# Patient Record
Sex: Male | Born: 1985 | Race: White | Hispanic: No | Marital: Married | Smoking: Never smoker
Health system: Southern US, Community
[De-identification: ages and names within clinical notes are randomized; demographics above are authoritative.]

## PROBLEM LIST (undated history)

## (undated) HISTORY — PX: OTHER SURGICAL HISTORY: SHX169

---

## 2015-07-21 ENCOUNTER — Ambulatory Visit (INDEPENDENT_AMBULATORY_CARE_PROVIDER_SITE_OTHER): Payer: BLUE CROSS/BLUE SHIELD | Admitting: Physician Assistant

## 2015-07-21 VITALS — BP 124/80 | HR 68 | Temp 98.5°F | Resp 14 | Ht 67.0 in | Wt 138.0 lb

## 2015-07-21 DIAGNOSIS — Z130 Encounter for screening for diseases of the blood and blood-forming organs and certain disorders involving the immune mechanism: Secondary | ICD-10-CM | POA: Diagnosis not present

## 2015-07-21 DIAGNOSIS — R350 Frequency of micturition: Secondary | ICD-10-CM

## 2015-07-21 DIAGNOSIS — Z7251 High risk heterosexual behavior: Secondary | ICD-10-CM

## 2015-07-21 DIAGNOSIS — Z13220 Encounter for screening for lipoid disorders: Secondary | ICD-10-CM

## 2015-07-21 DIAGNOSIS — Z131 Encounter for screening for diabetes mellitus: Secondary | ICD-10-CM | POA: Diagnosis not present

## 2015-07-21 DIAGNOSIS — Z1329 Encounter for screening for other suspected endocrine disorder: Secondary | ICD-10-CM | POA: Diagnosis not present

## 2015-07-21 DIAGNOSIS — Z0000 Encounter for general adult medical examination without abnormal findings: Secondary | ICD-10-CM | POA: Diagnosis not present

## 2015-07-21 LAB — POCT URINALYSIS DIPSTICK
Bilirubin, UA: NEGATIVE
Glucose, UA: NEGATIVE
KETONES UA: NEGATIVE
Leukocytes, UA: NEGATIVE
Nitrite, UA: NEGATIVE
Protein, UA: NEGATIVE
RBC UA: NEGATIVE
Spec Grav, UA: 1.025
Urobilinogen, UA: 0.2
pH, UA: 6

## 2015-07-21 LAB — POCT UA - MICROSCOPIC ONLY
Bacteria, U Microscopic: NEGATIVE
Casts, Ur, LPF, POC: NEGATIVE
Crystals, Ur, HPF, POC: NEGATIVE
Epithelial cells, urine per micros: NEGATIVE
Yeast, UA: NEGATIVE

## 2015-07-21 NOTE — Progress Notes (Signed)
Subjective:    Patient ID: Robert Schneider., male    DOB: 06-10-1986, 29 y.o.   MRN: [id number]  HPI Patient present for complete physical with complaint of urinary frequency. Frequency has been present for past 2 days and wife was recently treated for UTI. Denies dysuria, urgency, dec vol, abdominal/flank pain.   Does not exercise and not following any particular diet. PMH neg and not taking any medications.  Health Maintenance: UTD on eye and dental exams.   Review of Systems  Constitutional: Negative.  Negative for fever, chills, activity change, appetite change, fatigue and unexpected weight change.  HENT: Negative.  Negative for tinnitus.   Eyes: Negative.  Negative for photophobia and visual disturbance.  Respiratory: Negative.  Negative for cough, shortness of breath and wheezing.   Cardiovascular: Negative.  Negative for chest pain, palpitations and leg swelling.  Gastrointestinal: Negative.  Negative for nausea, vomiting, diarrhea, constipation and blood in stool.  Genitourinary: Positive for frequency. Negative for dysuria, urgency, hematuria, flank pain, decreased urine volume, discharge, penile swelling, scrotal swelling, difficulty urinating, genital sores, penile pain and testicular pain.  Musculoskeletal: Negative for myalgias, back pain, joint swelling, arthralgias and gait problem.  Neurological: Negative.  Negative for dizziness, weakness, light-headedness and headaches.  Psychiatric/Behavioral: Negative.        Objective:   Physical Exam  Constitutional: He is oriented to person, place, and time. He appears well-developed and well-nourished.  Blood pressure 124/80, pulse 68, temperature 98.5 F (36.9 C), temperature source Oral, resp. rate 14, height  (1.702 m), weight 138 lb (62.596 kg), SpO2 98 %.  HENT:  Head: Normocephalic and atraumatic.  Right Ear: Hearing, tympanic membrane, external ear and ear canal normal.  Left Ear: Hearing, tympanic membrane,  external ear and ear canal normal.  Nose: Nose normal.  Mouth/Throat: Uvula is midline, oropharynx is clear and moist and mucous membranes are normal.  Eyes: Conjunctivae, EOM and lids are normal. Pupils are equal, round, and reactive to light. Right eye exhibits no discharge. Left eye exhibits no discharge. No scleral icterus.  Neck: Trachea normal and normal range of motion. Neck supple. Carotid bruit is not present.  Cardiovascular: Normal rate, regular rhythm, normal heart sounds, intact distal pulses and normal pulses.   No murmur heard. Pulmonary/Chest: Effort normal and breath sounds normal. No respiratory distress. He has no decreased breath sounds. He has no wheezes. He has no rhonchi. He has no rales.  Abdominal: Soft. Normal appearance and bowel sounds are normal. He exhibits no distension and no abdominal bruit. There is no tenderness. There is no rebound, no guarding and no CVA tenderness. No hernia.  Musculoskeletal: Normal range of motion. He exhibits no edema or tenderness.  Lymphadenopathy:       Head (right side): No submental, no submandibular, no tonsillar, no preauricular, no posterior auricular and no occipital adenopathy present.       Head (left side): No submental, no submandibular, no tonsillar, no preauricular, no posterior auricular and no occipital adenopathy present.    He has no cervical adenopathy.  Neurological: He is alert and oriented to person, place, and time. He has normal strength and normal reflexes. No cranial nerve deficit or sensory deficit. Coordination and gait normal.  Skin: Skin is warm, dry and intact. No lesion and no rash noted.  Psychiatric: He has a normal mood and affect. His speech is normal and behavior is normal. Judgment and thought content normal.   Results for orders placed or performed  in visit on 07/21/15  POCT UA - Microscopic Only  Result Value Ref Range   WBC, Ur, HPF, POC 0-1    RBC, urine, microscopic 0-2    Bacteria, U  Microscopic neg    Mucus, UA small    Epithelial cells, urine per micros neg    Crystals, Ur, HPF, POC neg    Casts, Ur, LPF, POC neg    Yeast, UA neg   POCT urinalysis dipstick  Result Value Ref Range   Color, UA Dk yellow    Clarity, UA clear    Glucose, UA neg    Bilirubin, UA neg    Ketones, UA neg    Spec Grav, UA 1.025    Blood, UA neg    pH, UA 6.0    Protein, UA neg    Urobilinogen, UA 0.2    Nitrite, UA neg    Leukocytes, UA Negative Negative       Assessment & Plan:  1. Physical exam, annual Age anticipatory guidance given and discussed. Form for work completed and scanned.   2. Urinary frequency UA clear. Will wait for rest of lab work.  3. Screening for deficiency anemia - CBC with Differential/Platelet  4. Screening for lipid disorders - Lipid panel  5. Screening for thyroid disorder - TSH  6. Unprotected sex - HIV antibody - GC/Chlamydia Probe Amp - RPR - POCT UA - Microscopic Only - POCT urinalysis dipstick  7. Screening for diabetes mellitus - Comprehensive metabolic panel   Janan Ridge PA-C  [facility] 07/21/2015 3:04 PM

## 2015-07-21 NOTE — Patient Instructions (Signed)

## 2015-07-22 LAB — COMPREHENSIVE METABOLIC PANEL
ALK PHOS: 64 U/L (ref 40–115)
ALT: 19 U/L (ref 9–46)
AST: 19 U/L (ref 10–40)
Albumin: 4.9 g/dL (ref 3.6–5.1)
BILIRUBIN TOTAL: 0.7 mg/dL (ref 0.2–1.2)
BUN: 17 mg/dL (ref 7–25)
CALCIUM: 9.8 mg/dL (ref 8.6–10.3)
CO2: 28 mmol/L (ref 20–31)
CREATININE: 0.86 mg/dL (ref 0.60–1.35)
Chloride: 102 mmol/L (ref 98–110)
GLUCOSE: 60 mg/dL — AB (ref 65–99)
POTASSIUM: 3.9 mmol/L (ref 3.5–5.3)
SODIUM: 139 mmol/L (ref 135–146)
TOTAL PROTEIN: 7.4 g/dL (ref 6.1–8.1)

## 2015-07-22 LAB — LIPID PANEL
Cholesterol: 147 mg/dL (ref 125–200)
HDL: 48 mg/dL (ref >=40)
LDL Cholesterol: 87 mg/dL (ref <130)
TRIGLYCERIDES: 61 mg/dL (ref <150)
Total CHOL/HDL Ratio: 3.1 Ratio (ref <=5.0)
VLDL: 12 mg/dL (ref <30)

## 2015-07-22 LAB — CBC WITH DIFFERENTIAL/PLATELET
Basophils Absolute: 0 10*3/uL (ref 0.0–0.1)
Basophils Relative: 0 % (ref 0–1)
Eosinophils Absolute: 0.2 10*3/uL (ref 0.0–0.7)
Eosinophils Relative: 2 % (ref 0–5)
HEMATOCRIT: 47.9 % (ref 39.0–52.0)
Hemoglobin: 16.5 g/dL (ref 13.0–17.0)
Lymphocytes Relative: 27 % (ref 12–46)
Lymphs Abs: 2.1 10*3/uL (ref 0.7–4.0)
MCH: 31.9 pg (ref 26.0–34.0)
MCHC: 34.4 g/dL (ref 30.0–36.0)
MCV: 92.6 fL (ref 78.0–100.0)
MPV: 8.6 fL (ref 8.6–12.4)
Monocytes Absolute: 0.6 10*3/uL (ref 0.1–1.0)
Monocytes Relative: 8 % (ref 3–12)
NEUTROS PCT: 63 % (ref 43–77)
Neutro Abs: 4.9 10*3/uL (ref 1.7–7.7)
PLATELETS: 295 10*3/uL (ref 150–400)
RBC: 5.17 MIL/uL (ref 4.22–5.81)
RDW: 12.5 % (ref 11.5–15.5)
WBC: 7.8 10*3/uL (ref 4.0–10.5)

## 2015-07-22 LAB — TSH: TSH: 1.442 u[IU]/mL (ref 0.350–4.500)

## 2015-07-22 LAB — GC/CHLAMYDIA PROBE AMP
CT PROBE, AMP APTIMA: NEGATIVE
GC PROBE AMP APTIMA: NEGATIVE

## 2015-07-22 LAB — HIV ANTIBODY (ROUTINE TESTING W REFLEX): HIV 1&2 Ab, 4th Generation: NONREACTIVE

## 2015-07-22 LAB — RPR

## 2015-07-31 ENCOUNTER — Telehealth: Payer: Self-pay | Admitting: Physician Assistant

## 2015-07-31 ENCOUNTER — Encounter: Payer: Self-pay | Admitting: Physician Assistant

## 2015-07-31 NOTE — Telephone Encounter (Signed)
Vmail full. Will send results which were all normal.

## 2021-05-05 ENCOUNTER — Emergency Department (HOSPITAL_COMMUNITY): Payer: 59

## 2021-05-05 ENCOUNTER — Encounter (HOSPITAL_COMMUNITY): Payer: Self-pay

## 2021-05-05 ENCOUNTER — Emergency Department (HOSPITAL_COMMUNITY)
Admission: EM | Admit: 2021-05-05 | Discharge: 2021-05-06 | Disposition: A | Discharge status: Home / Self Care | Payer: 59 | Attending: Emergency Medicine | Admitting: Emergency Medicine

## 2021-05-05 ENCOUNTER — Other Ambulatory Visit: Payer: Self-pay

## 2021-05-05 DIAGNOSIS — R202 Paresthesia of skin: Secondary | ICD-10-CM

## 2021-05-05 LAB — CBC WITH DIFFERENTIAL/PLATELET
Abs Immature Granulocytes: 0.01 10*3/uL (ref 0.00–0.07)
Basophils Absolute: 0.1 10*3/uL (ref 0.0–0.1)
Basophils Relative: 1 %
Eosinophils Absolute: 0.2 10*3/uL (ref 0.0–0.5)
Eosinophils Relative: 3 %
HCT: 42.1 % (ref 39.0–52.0)
Hemoglobin: 14.5 g/dL (ref 13.0–17.0)
Immature Granulocytes: 0 %
Lymphocytes Relative: 34 %
Lymphs Abs: 2.3 10*3/uL (ref 0.7–4.0)
MCH: 32.4 pg (ref 26.0–34.0)
MCHC: 34.4 g/dL (ref 30.0–36.0)
MCV: 94.2 fL (ref 80.0–100.0)
Monocytes Absolute: 0.6 10*3/uL (ref 0.1–1.0)
Monocytes Relative: 8 %
Neutro Abs: 3.6 10*3/uL (ref 1.7–7.7)
Neutrophils Relative %: 54 %
Platelets: 226 10*3/uL (ref 150–400)
RBC: 4.47 MIL/uL (ref 4.22–5.81)
RDW: 11.2 % — ABNORMAL LOW (ref 11.5–15.5)
WBC: 6.6 10*3/uL (ref 4.0–10.5)
nRBC: 0 % (ref 0.0–0.2)

## 2021-05-05 LAB — BASIC METABOLIC PANEL
Anion gap: 6 (ref 5–15)
BUN: 13 mg/dL (ref 6–20)
CO2: 28 mmol/L (ref 22–32)
Calcium: 9 mg/dL (ref 8.9–10.3)
Chloride: 103 mmol/L (ref 98–111)
Creatinine, Ser: 0.91 mg/dL (ref 0.61–1.24)
GFR, Estimated: >60 mL/min (ref >60)
Glucose, Bld: 105 mg/dL — ABNORMAL HIGH (ref 70–99)
Potassium: 3.3 mmol/L — ABNORMAL LOW (ref 3.5–5.1)
Sodium: 137 mmol/L (ref 135–145)

## 2021-05-05 LAB — URINALYSIS, ROUTINE W REFLEX MICROSCOPIC
Bilirubin Urine: NEGATIVE
Glucose, UA: NEGATIVE mg/dL
Hgb urine dipstick: NEGATIVE
Ketones, ur: NEGATIVE mg/dL
Leukocytes,Ua: NEGATIVE
Nitrite: NEGATIVE
Protein, ur: NEGATIVE mg/dL
Specific Gravity, Urine: 1.01 (ref 1.005–1.030)
pH: 6 (ref 5.0–8.0)

## 2021-05-05 NOTE — ED Notes (Signed)
Pt states whenever he urinates, urine will trickle out. "my only partner has been by wife". Denies any pain or discharge. Requesting to give urine sample.

## 2021-05-05 NOTE — ED Triage Notes (Signed)
Left facial numbness started today at 1800. Denies any other symptoms. Denies any slurring of speech, trauma, facial droop, and pain.    Pt states his BP has been running high 160/90s throughout this week and he isnt on any meds.

## 2021-05-05 NOTE — ED Provider Notes (Signed)
[name] EMERGENCY DEPARTMENT Provider Note   CSN: [id number] Arrival date & time: 05/05/21  1859     History Chief Complaint  Patient presents with  . facial numbness   Robert Schneider. is a 35 y.o. male with no significant past medical history who presents to [facility] ED for evaluation of facial numbness.   Patient reports that earlier this week on either Tuesday or Wednesday evening, he experienced a left temporal headache which he rates was a 5 to 6 out of 10 in severity and associated with distended veins in his left temple. He denies pain with chewing or changes in his vision. Patient did not take anything for this headache and it resolved by the time that he woke up the next morning. He states that he gets headaches approximately once a month, however this one felt differently. He took his blood pressure at home using a blood pressure cuff which was elevated to the 140s systolic. He was feeling well until earlier today when he experienced the onset of some mild numbness to his left cheek. Otherwise, he denies any further associated symptoms.     No past medical history on file.  There are no problems to display for this patient.   Past Surgical History:  Procedure Laterality Date  . eutachian tube surgery      Family History  Problem Relation Age of Onset  . Stroke Father    Social History   Tobacco Use  . Smoking status: Never Smoker  . Smokeless tobacco: Never Used  Substance Use Topics  . Alcohol use: No    Alcohol/week: 0.0 standard drinks  . Drug use: No   Home Medications Prior to Admission medications   Not on File   Allergies    Patient has no known allergies.  Review of Systems   Review of Systems  Constitutional: Negative for chills and fever.  HENT: Negative for ear pain and sore throat.   Eyes: Negative for pain and visual disturbance.  Respiratory: Negative for cough and shortness of breath.   Cardiovascular:  Negative for chest pain and palpitations.  Gastrointestinal: Negative for abdominal pain and vomiting.  Genitourinary: Negative for dysuria and hematuria.  Musculoskeletal: Negative for arthralgias and back pain.  Skin: Negative for color change and rash.  Neurological: Negative for seizures and syncope.       Numbness of left cheek  All other systems reviewed and are negative.   Physical Exam Updated Vital Signs BP (!) 158/99   Pulse 82   Temp 98.2 F (36.8 C) (Oral)   Resp 17   Ht 5\' 7"  (1.702 m)   Wt 63.5 kg   SpO2 100%   BMI 21.93 kg/m   Physical Exam Vitals and nursing note reviewed.  Constitutional:      General: He is not in acute distress.    Appearance: He is well-developed. He is not ill-appearing.  HENT:     Head: Normocephalic and atraumatic.  Eyes:     Conjunctiva/sclera: Conjunctivae normal.  Cardiovascular:     Rate and Rhythm: Normal rate and regular rhythm.     Heart sounds: No murmur heard.   Pulmonary:     Effort: Pulmonary effort is normal. No respiratory distress.     Breath sounds: Normal breath sounds.  Abdominal:     Palpations: Abdomen is soft.     Tenderness: There is no abdominal tenderness.  Musculoskeletal:     Cervical back: Neck supple.  Skin:    General: Skin is warm and dry.  Neurological:     General: No focal deficit present.     Mental Status: He is alert and oriented to person, place, and time. Mental status is at baseline.     Cranial Nerves: No cranial nerve deficit.     Sensory: No sensory deficit.     Motor: No weakness.     Comments: Cranial nerves intact, no sensory deficit or weakness on examination.    ED Results / Procedures / Treatments   Labs (all labs ordered are listed, but only abnormal results are displayed) Labs Reviewed  URINALYSIS, ROUTINE W REFLEX MICROSCOPIC - Abnormal; Notable for the following components:      Result Value   Color, Urine STRAW (*)    All other components within normal limits   BASIC METABOLIC PANEL - Abnormal; Notable for the following components:   Potassium 3.3 (*)    Glucose, Bld 105 (*)    All other components within normal limits  CBC WITH DIFFERENTIAL/PLATELET - Abnormal; Notable for the following components:   RDW 11.2 (*)    All other components within normal limits   EKG None  Radiology CT Head Wo Contrast  Result Date: 05/05/2021 CLINICAL DATA:  Elevated blood pressure with bulging veins on the left side of his head. EXAM: CT HEAD WITHOUT CONTRAST TECHNIQUE: Contiguous axial images were obtained from the base of the skull through the vertex without intravenous contrast. COMPARISON:  None. FINDINGS: Brain: No evidence of acute infarction, hydrocephalus, extra-axial collection or mass lesion. An ill-defined 3 mm focus of mildly increased attenuation is seen along the junction of the cortex and white matter within the right parieto-occipital region (axial CT image 18, CT series number 3/coronal reformatted image 46, CT series number 5). There is no evidence of associated edema, mass effect or midline shift. Vascular: No hyperdense vessel or unexpected calcification. Skull: Normal. Negative for fracture or focal lesion. Sinuses/Orbits: No acute finding. Other: None. IMPRESSION: Tiny hyperdense focus within the right parieto-occipital region which may represent a very small amount of acute parenchymal blood. MRI correlation is recommended. Electronically Signed   By: Aram Candela M.D.   On: 05/05/2021 20:38   Medications Ordered in ED Medications - No data to display  ED Course  I have reviewed the triage vital signs and the nursing notes.  Pertinent labs & imaging results that were available during my care of the patient were reviewed by me and considered in my medical decision making (see chart for details).    MDM Rules/Calculators/A&P                          Patient presents with left temporal headache a few days ago followed by the onset of  left-sided facial numbness earlier today. Patient hemodynamically stable with unremarkable CBC, BMP and urinalysis. Temporal arteritis considered although patient denies visual symptoms, jaw pain, fever and has no pain with palpation of the temple. Patient's symptoms concerning for CVA therefore head CT obtained which showed hyperdense focus in the right parieto-occipital region with recommendation for MRI correlation. Discussed patient's case with neurology with recommendation to obtain MRI Brain with and without contrast. Signed off patient to Dr. Pilar Plate for further care.  Final Clinical Impression(s) / ED Diagnoses Final diagnoses:  None    Rx / DC Orders ED Discharge Orders    None       Roylene Reason, MD 05/05/21 2323  Charlynne Pander, MD 05/06/21 [phone]

## 2021-05-05 NOTE — ED Provider Notes (Signed)
Emergency Medicine Provider Triage Evaluation Note  Robert Schneider. , a 35 y.o. male  was evaluated in triage.  Pt complains of Urinary dribbling however is primarily here for headache.  He states he has had 2 episodes of severe left temporal headache.  No vision changes but states that he does not have a history of headaches this is a new issue for him.  He states he has associated numbness on the left side of his face.  He is uncertain whether he had a facial droop and was not told that he did.  He denies any slurred speech or confusion.  No head injuries or trauma.  He has no history of cancer.  Review of Systems  Positive: Headache, facial numbness Negative: Nausea vomiting  Physical Exam  BP (!) 139/105 (BP Location: Right Arm)   Pulse 74   Temp 98.2 F (36.8 C) (Oral)   Resp 16   Ht 5\' 7"  (1.702 m)   Wt 63.5 kg   SpO2 100%   BMI 21.93 kg/m  Gen:   Awake, no distress   Resp:  Normal effort  MSK:   Moves extremities without difficulty  Other:   Alert and oriented to self, place, time and event.   Speech is fluent, clear without dysarthria or dysphasia.   Strength 5/5 in upper/lower extremities   Sensation intact in upper/lower extremities   Normal gait.  CN I not tested  CN II grossly intact visual fields bilaterally. Did not visualize posterior eye.  CN III, IV, VI PERRLA and EOMs intact bilaterally  CN V Intact sensation to sharp and light touch to the face  CN VII facial movements symmetric  CN VIII not tested  CN IX, X no uvula deviation, symmetric rise of soft palate  CN XI 5/5 SCM and trapezius strength bilaterally  CN XII Midline tongue protrusion, symmetric L/R movements    Medical Decision Making  Medically screening exam initiated at 7:44 PM.  Appropriate orders placed.  . was informed that the remainder of the evaluation will be completed by another provider, this initial triage assessment does not replace that evaluation, and the  importance of remaining in the ED until their evaluation is complete.  Patient is overall very well-appearing but quite nervous about his symptoms.  Given his new headache and he is now history of occasional headaches he will obtain CT head without contrast low suspicion for tumor but given his numbness of his face have some concern for trigeminal neuralgia versus complex migraine versus migraine  No visual symptoms indicate giant cell arteritis also he is very young for this.   Veronda Prude Berwind, DOLE 05/05/21 1954    05/07/21, MD 05/05/21 2008

## 2021-05-06 MED ORDER — GADOBUTROL 1 MMOL/ML IV SOLN
6.5000 mL | Freq: Once | INTRAVENOUS | Status: AC | PRN
  Administered 2021-05-06: 6.5 mL via INTRAVENOUS

## 2021-05-06 NOTE — ED Provider Notes (Signed)
  Provider Note MRN:  [id number]  Arrival date & time: 05/06/21    ED Course and Medical Decision Making  Assumed care from Dr. Silverio Lay at shift change.  Headache with facial sensory abnormalities, suspect complex migraine but will obtain MRI to exclude acute stroke.  MRI is negative, on exam patient is doing well without any neurological complaints or deficits, appropriate for discharge.  Procedures  Final Clinical Impressions(s) / ED Diagnoses     ICD-10-CM   1. Facial paresthesia  R20.2     ED Discharge Orders    None        Discharge Instructions     You were evaluated in the Emergency Department and after careful evaluation, we did not find any emergent condition requiring admission or further testing in the hospital.  Your exam/testing today is overall reassuring.  MRI did not show any abnormalities.  Recommend follow-up with the neurologist for further management.  Please return to the Emergency Department if you experience any worsening of your condition.   Thank you for allowing [place] to be a part of your care.     Elmer Sow. Pilar Plate, MD [facility] Emergency Medicine [facility] [email or web address]    Sabas Sous, MD 05/06/21 [phone]

## 2021-05-06 NOTE — Discharge Instructions (Signed)
You were evaluated in the Emergency Department and after careful evaluation, we did not find any emergent condition requiring admission or further testing in the hospital.  Your exam/testing today is overall reassuring.  MRI did not show any abnormalities.  Recommend follow-up with the neurologist for further management.  Please return to the Emergency Department if you experience any worsening of your condition.   Thank you for allowing [place] to be a part of your care.

## 2022-11-21 IMAGING — MR MR HEAD WO/W CM
13 of 18 series · 28 of 48 positions shown · IV contrast (gadavist)
Comparison: None.

CLINICAL DATA: Transient ischemic attack

EXAM:
MRI HEAD WITHOUT AND WITH CONTRAST
TECHNIQUE: Multiplanar, multiecho pulse sequences of the brain and surrounding
structures were obtained without and with intravenous contrast.
CONTRAST:  6.5mL GADAVIST GADOBUTROL 1 MMOL/ML IV SOLN

[Series 5: DWI · axial · 3.0mm · 0.88mm/px · z∈[-129,+17]mm · 4 of 100 slices shown (1 of 4)]
[im 1/100]
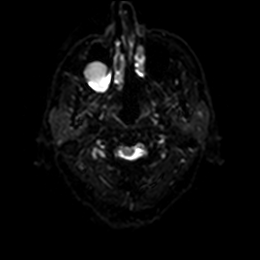
[im 34/100]
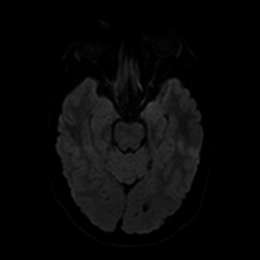
[im 67/100]
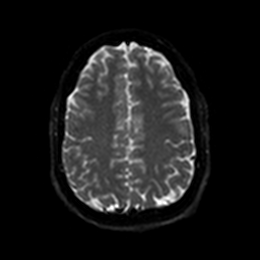
[im 100/100]
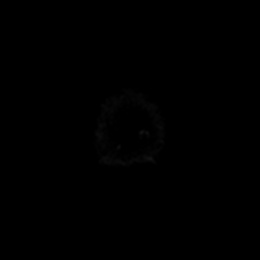

[Series 6: DWI · axial · 3.0mm · 0.88mm/px · z∈[-129,+17]mm · 2 of 48 slices shown (2 of 4)]
[im 1/48]
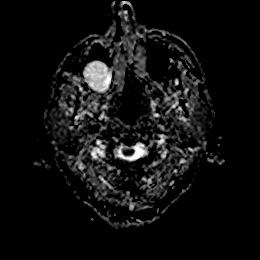
[im 48/48]
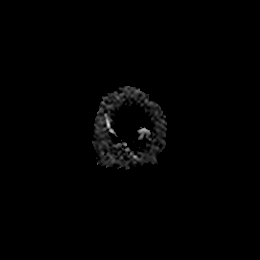

[Series 7: DWI · coronal · 4.0mm · 0.88mm/px · 3 of 64 slices shown (3 of 4)]
[im 1/64]
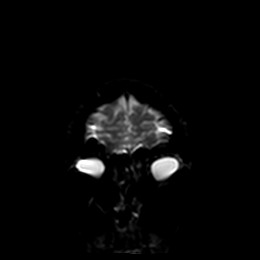
[im 32/64]
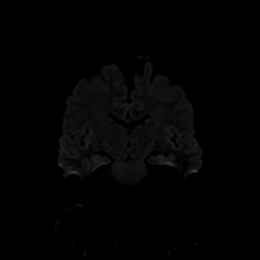
[im 64/64]
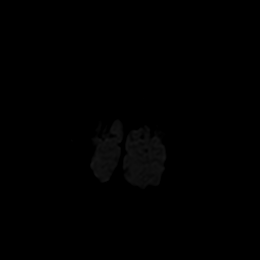

[Series 8: DWI · coronal · 4.0mm · 0.88mm/px · 1 of 32 slices shown (4 of 4)]
[im 1/32]
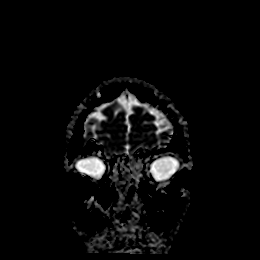

[Series 9: t2_space_dark-fluid_sag_p2_ns-ir · sagittal · 1.0mm · 0.49mm/px · 8 of 192 slices shown]
[im 1/192]
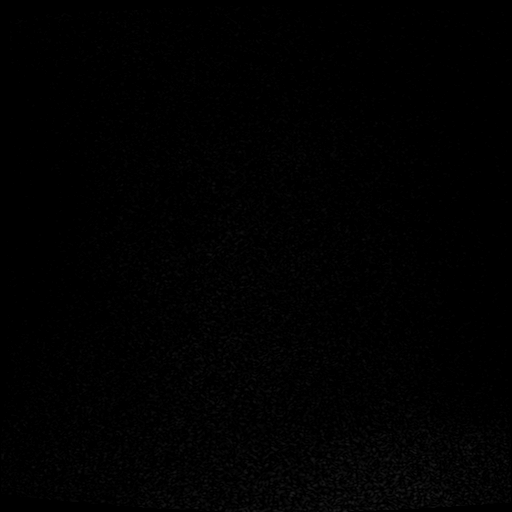
[im 28/192]
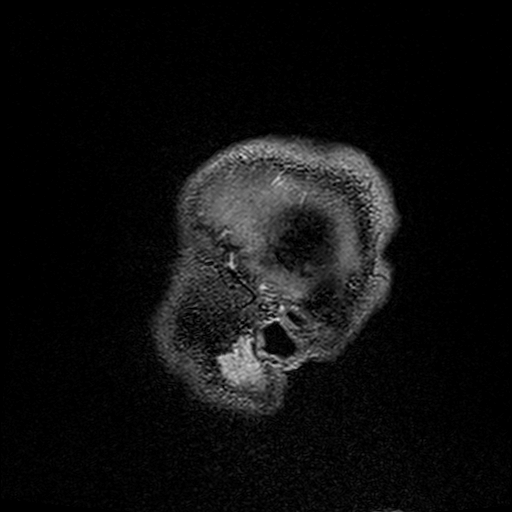
[im 55/192]
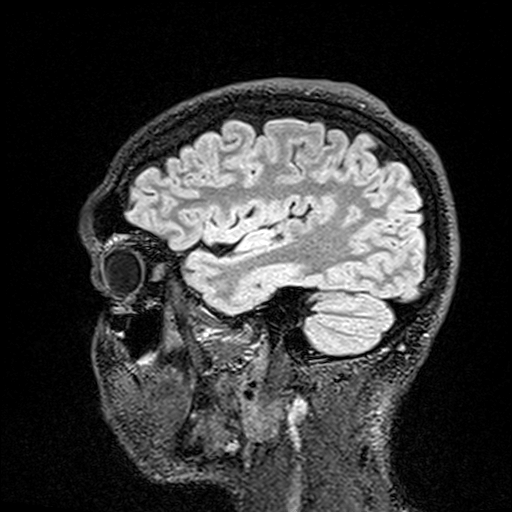
[im 82/192]
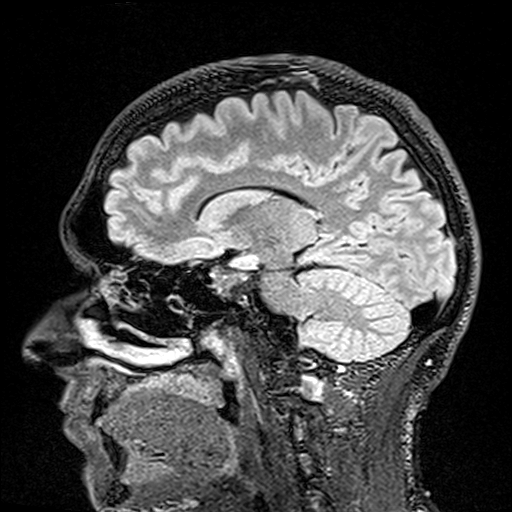
[im 110/192]
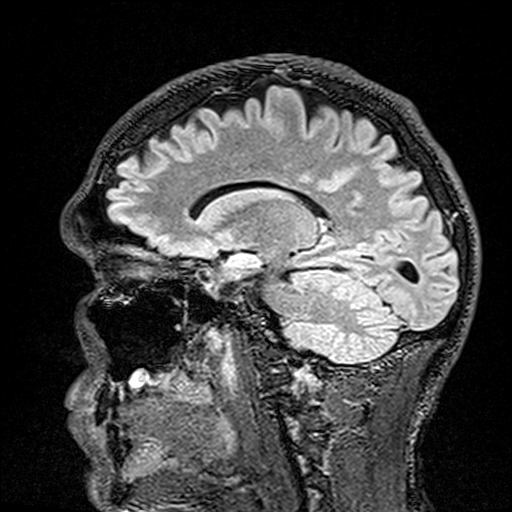
[im 137/192]
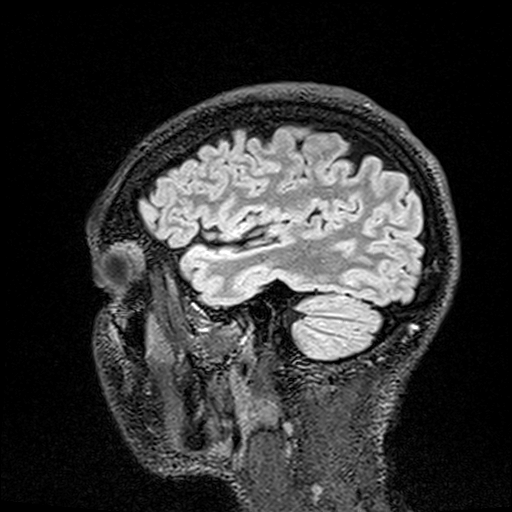
[im 164/192]
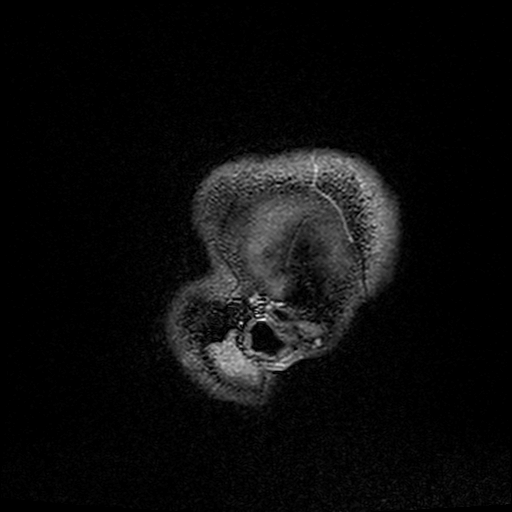
[im 192/192]
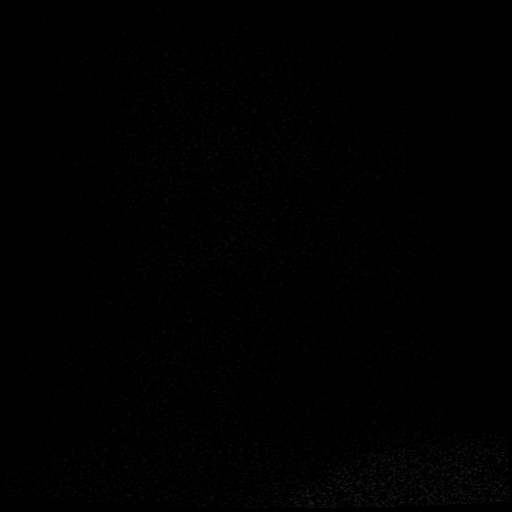

[Series 10: t2_space_dark-fluid_sag_p2_ns-ir_mpr_ axial · axial · 1.0mm · 0.45mm/px · z∈[-99,-41]mm · 3 of 120 slices shown]
[im 1/120]
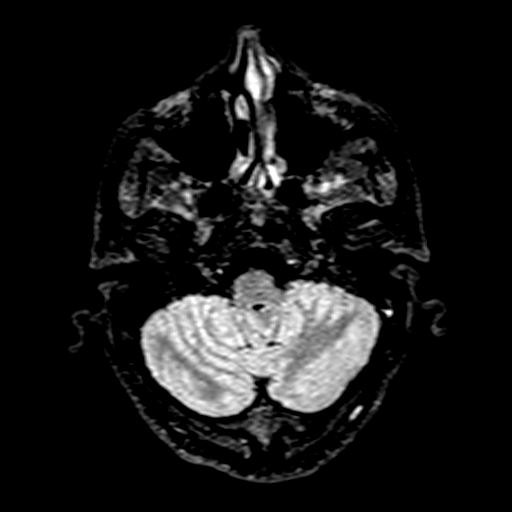
[im 30/120]
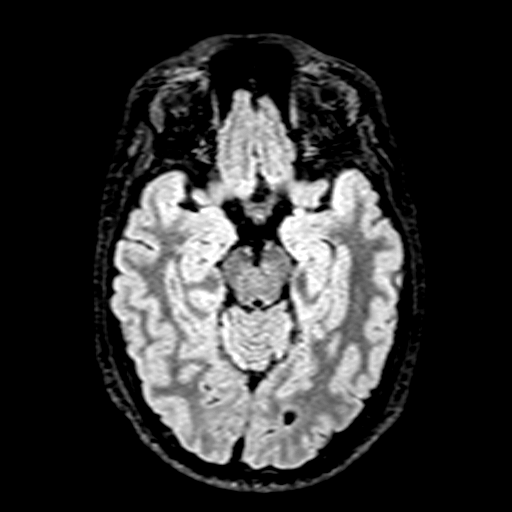
[im 60/120]
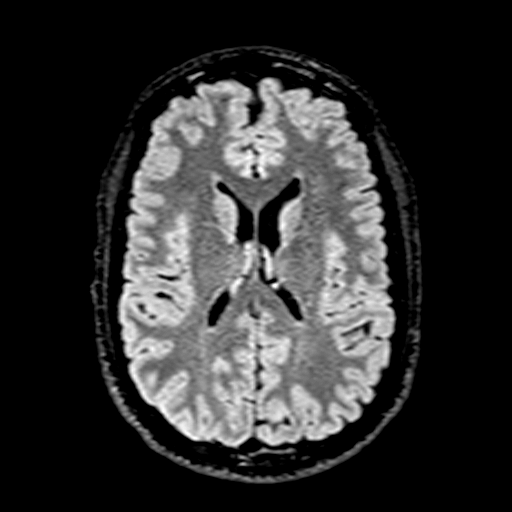

[Series 12: T1 · sagittal · 5.0mm · 0.75mm/px · 1 of 23 slices shown]
[im 1/23]
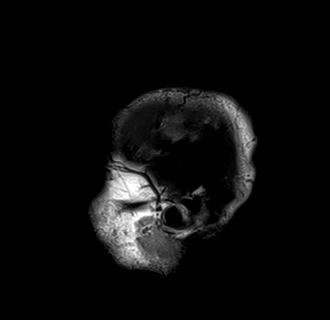

[Series 13: T2 · axial · 5.0mm · 0.72mm/px · 1 of 25 slices shown (1 of 2)]
[im 1/25]
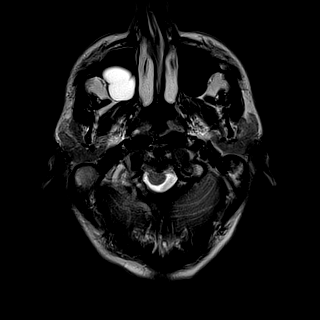

[Series 14: FLAIR · axial · 5.0mm · 0.45mm/px · 1 of 25 slices shown]
[im 1/25]
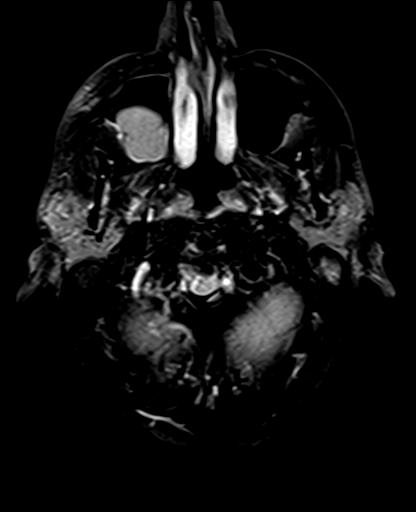

[Series 20: T2 · coronal · 5.0mm · 0.34mm/px · 1 of 29 slices shown (2 of 2)]
[im 1/29]
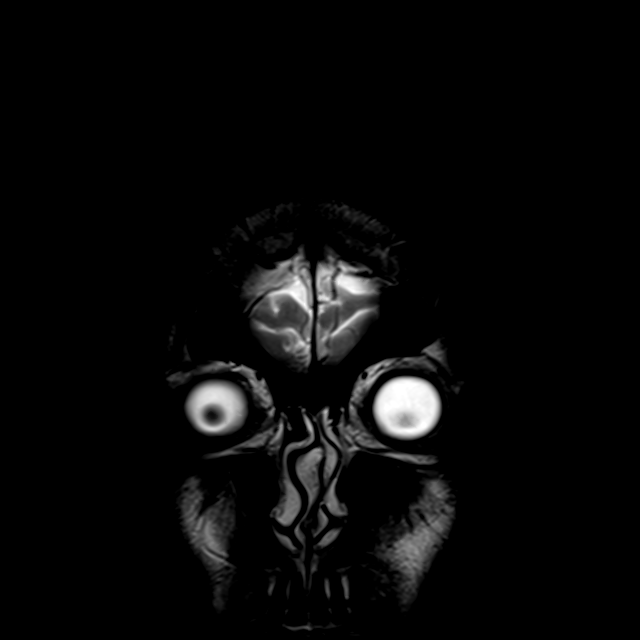

[Series 21: T2 post-contrast · coronal · 5.0mm · 0.72mm/px · 1 of 28 slices shown]
[im 1/28]
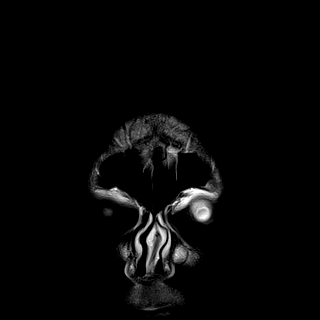

[Series 23: T1 post-contrast · coronal · 5.0mm · 0.34mm/px · 1 of 29 slices shown (1 of 2)]
[im 1/29]
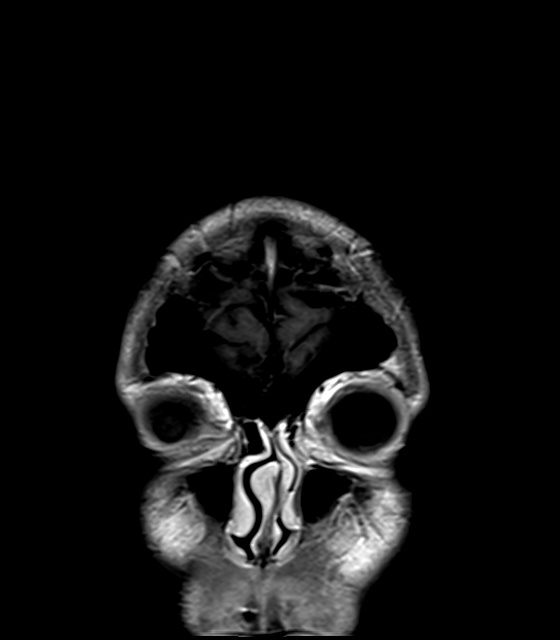

[Series 24: T1 post-contrast · sagittal · 4.0mm · 0.72mm/px · 1 of 31 slices shown (2 of 2)]
[im 1/31]
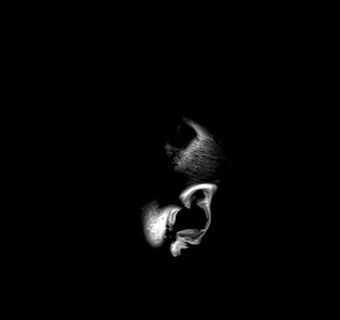

[28 of 48 positions shown; findings below may reference images not displayed]

FINDINGS: Brain: No acute infarct, mass effect or extra-axial collection. No
acute or chronic hemorrhage. Normal white matter signal, parenchymal
volume and CSF spaces. The midline structures are normal. There is
no abnormal contrast enhancement.

Vascular: Major flow voids are preserved.

Skull and upper cervical spine: Normal calvarium and skull base.
Visualized upper cervical spine and soft tissues are normal.

Sinuses/Orbits:Right maxillary sinus retention cyst.  Normal orbits.
IMPRESSION: Normal brain MRI.

## 2023-05-15 ENCOUNTER — Encounter: Payer: Self-pay | Admitting: Podiatry

## 2023-05-15 ENCOUNTER — Ambulatory Visit: Payer: Commercial Managed Care - PPO | Admitting: Podiatry

## 2023-05-15 DIAGNOSIS — B070 Plantar wart: Secondary | ICD-10-CM | POA: Diagnosis not present

## 2023-05-16 MED ORDER — FLUOROURACIL 5 % EX CREA: TOPICAL_CREAM | Freq: Two times a day (BID) | CUTANEOUS | 2 refills | Status: AC

## 2023-05-16 MED ORDER — CIMETIDINE 800 MG PO TABS: 800.0000 mg | ORAL_TABLET | Freq: Every day | ORAL | 1 refills | Status: DC

## 2023-05-16 NOTE — Progress Notes (Signed)
Subjective:   Patient ID: Robert Prude., male   DOB: 37 y.o.   MRN: [id number]   HPI Patient presents stating that he has a mass on the bottom of his right foot that has been there for several months.  He had worked on by dermatologist with freezing and has not been successful and it is painful to walk on.  Patient does not smoke likes to be active   Review of Systems  All other systems reviewed and are negative.       Objective:  Physical Exam Vitals and nursing note reviewed.  Constitutional:      Appearance: He is well-developed.  Pulmonary:     Effort: Pulmonary effort is normal.  Musculoskeletal:        General: Normal range of motion.  Skin:    General: Skin is warm.  Neurological:     Mental Status: He is alert.     Neurovascular status found to be intact muscle strength was found to be adequate range of motion adequate.  Patient is noted to have lesion plantar aspect right lateral foot that measures about 1 cm x 8 mm and upon debridement shows pinpoint bleeding with pain to lateral pressure.  Patient is found to have good digital perfusion well-oriented x 3     Assessment:  Verruca plantaris plantar aspect right most likely cannot rule out other causes soft tissue lesion     Plan:  H&P reviewed reviewed and treated conservatively I did sterile debridement of the area I then applied chemical agent to create immune response with sterile dressing placed him on Efudex and Tagamet and patient will be seen back to reevaluate in 6 weeks

## 2023-06-02 ENCOUNTER — Other Ambulatory Visit: Payer: Self-pay | Admitting: Podiatry

## 2023-06-26 ENCOUNTER — Ambulatory Visit: Payer: BLUE CROSS/BLUE SHIELD | Admitting: Podiatry
